# Patient Record
Sex: Female | Born: 1985 | Race: White | Hispanic: No | Marital: Single
Health system: Southern US, Community
[De-identification: ages and names within clinical notes are randomized; demographics above are authoritative.]

---
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
## 2007-01-04 ENCOUNTER — Ambulatory Visit: Payer: Self-pay | Admitting: Internal Medicine

## 2007-01-07 IMAGING — CR DG CHEST 1V PORT
1 series · 1 of 1 positions shown · non-contrast
Comparison: none

REASON FOR EXAM: Port-A-Cath placement
COMMENTS:

[view not recorded]
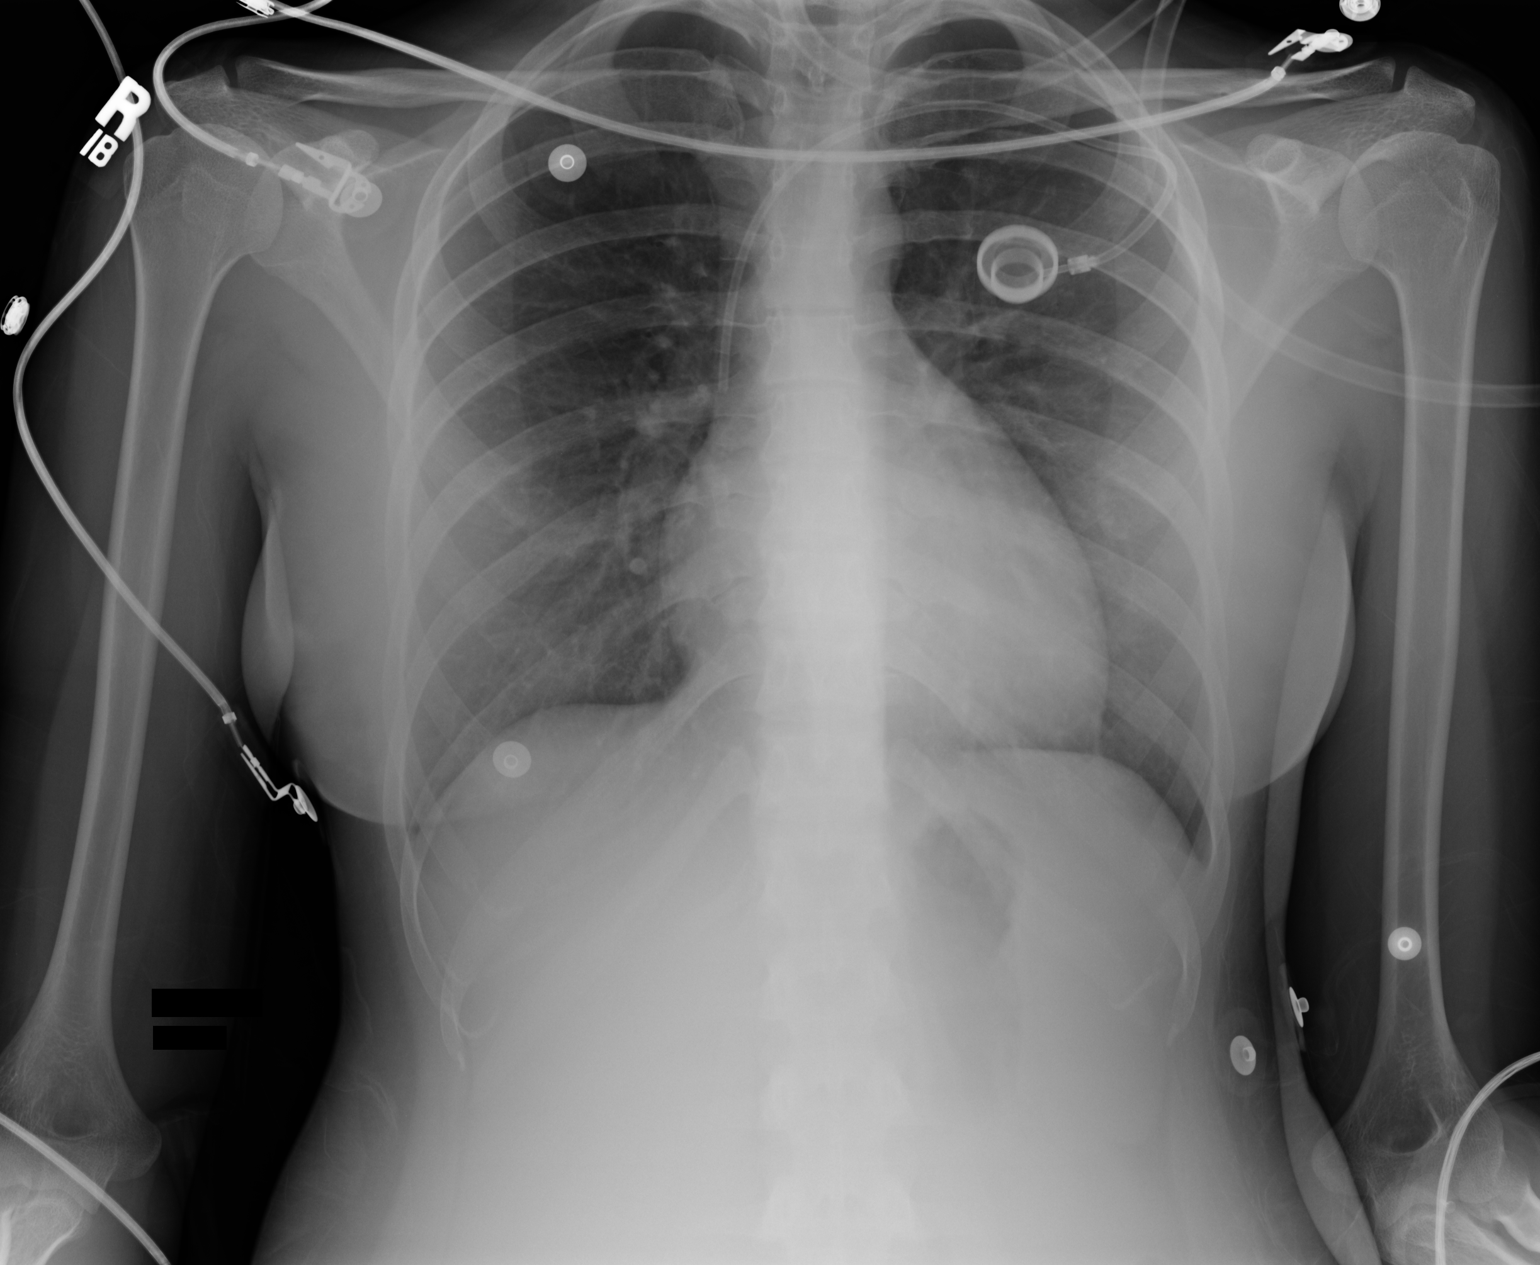

[1 of 1 positions shown; findings below may reference images not displayed]

PROCEDURE:     DXR - DXR PORTABLE CHEST SINGLE VIEW  - July 26, 2006 [DATE]

RESULT:          AP view of the chest is compared to the prior exam of
06/23/2006.

A Port-A-Cath is now present.  The tip of the Port-A-Cath is projected over
the inferior portion of the superior vena cava.  No pneumothorax is seen.
Heart size is normal.
IMPRESSION: 1.     The lung fields are clear.
2.     A Port-A-Cath is now present.

## 2007-01-18 ENCOUNTER — Ambulatory Visit: Payer: Self-pay | Admitting: Internal Medicine

## 2007-02-02 ENCOUNTER — Ambulatory Visit: Payer: Self-pay | Admitting: Internal Medicine

## 2007-02-12 ENCOUNTER — Inpatient Hospital Stay: Payer: Self-pay | Admitting: Otolaryngology

## 2007-03-05 ENCOUNTER — Ambulatory Visit: Payer: Self-pay | Admitting: Internal Medicine

## 2007-04-04 ENCOUNTER — Ambulatory Visit: Payer: Self-pay | Admitting: Internal Medicine

## 2007-05-05 ENCOUNTER — Ambulatory Visit: Payer: Self-pay | Admitting: Internal Medicine

## 2007-05-17 ENCOUNTER — Ambulatory Visit: Payer: Self-pay | Admitting: Internal Medicine

## 2007-06-04 ENCOUNTER — Ambulatory Visit: Payer: Self-pay | Admitting: Internal Medicine

## 2007-07-05 ENCOUNTER — Ambulatory Visit: Payer: Self-pay | Admitting: Internal Medicine

## 2007-07-26 ENCOUNTER — Ambulatory Visit: Payer: Self-pay | Admitting: Internal Medicine

## 2007-08-05 ENCOUNTER — Ambulatory Visit: Payer: Self-pay | Admitting: Internal Medicine

## 2007-09-04 ENCOUNTER — Ambulatory Visit: Payer: Self-pay | Admitting: Internal Medicine

## 2007-10-05 ENCOUNTER — Ambulatory Visit: Payer: Self-pay | Admitting: Internal Medicine

## 2007-10-25 ENCOUNTER — Ambulatory Visit: Payer: Self-pay | Admitting: Internal Medicine

## 2007-11-04 ENCOUNTER — Ambulatory Visit: Payer: Self-pay | Admitting: Internal Medicine

## 2007-12-05 ENCOUNTER — Ambulatory Visit: Payer: Self-pay | Admitting: Internal Medicine

## 2007-12-16 ENCOUNTER — Ambulatory Visit: Payer: Self-pay | Admitting: Internal Medicine

## 2008-01-05 ENCOUNTER — Ambulatory Visit: Payer: Self-pay | Admitting: Internal Medicine

## 2008-02-02 ENCOUNTER — Ambulatory Visit: Payer: Self-pay | Admitting: Internal Medicine

## 2008-02-26 ENCOUNTER — Ambulatory Visit: Payer: Self-pay | Admitting: Internal Medicine

## 2008-03-04 ENCOUNTER — Ambulatory Visit: Payer: Self-pay | Admitting: Internal Medicine

## 2008-04-03 ENCOUNTER — Ambulatory Visit: Payer: Self-pay | Admitting: Internal Medicine

## 2008-04-10 ENCOUNTER — Ambulatory Visit: Payer: Self-pay | Admitting: Internal Medicine

## 2008-04-13 ENCOUNTER — Ambulatory Visit: Payer: Self-pay | Admitting: Internal Medicine

## 2008-05-04 ENCOUNTER — Ambulatory Visit: Payer: Self-pay | Admitting: Internal Medicine

## 2008-06-03 ENCOUNTER — Ambulatory Visit: Payer: Self-pay | Admitting: Internal Medicine

## 2008-07-04 ENCOUNTER — Ambulatory Visit: Payer: Self-pay | Admitting: Internal Medicine

## 2008-08-04 ENCOUNTER — Ambulatory Visit: Payer: Self-pay | Admitting: Internal Medicine

## 2008-08-21 ENCOUNTER — Ambulatory Visit: Payer: Self-pay | Admitting: Internal Medicine

## 2008-09-03 ENCOUNTER — Ambulatory Visit: Payer: Self-pay | Admitting: Internal Medicine

## 2008-10-04 ENCOUNTER — Ambulatory Visit: Payer: Self-pay | Admitting: Internal Medicine

## 2008-11-03 ENCOUNTER — Ambulatory Visit: Payer: Self-pay | Admitting: Internal Medicine

## 2008-12-04 ENCOUNTER — Ambulatory Visit: Payer: Self-pay | Admitting: Internal Medicine

## 2009-01-04 ENCOUNTER — Ambulatory Visit: Payer: Self-pay | Admitting: Internal Medicine

## 2009-01-12 ENCOUNTER — Ambulatory Visit: Payer: Self-pay | Admitting: Internal Medicine

## 2009-02-01 ENCOUNTER — Ambulatory Visit: Payer: Self-pay | Admitting: Internal Medicine

## 2009-03-04 ENCOUNTER — Ambulatory Visit: Payer: Self-pay | Admitting: Internal Medicine

## 2009-03-28 IMAGING — CT CT ABD-PELV W/ CM
1 of 2 series · 15 of 32 positions shown, 19 images · non-contrast
Comparison: none

REASON FOR EXAM: nasopharyngeal  CA  abnormal PET scan eval pelvic mass
vs bowel
COMMENTS:

[Series 2: abdomen · axial · 0.57mm/px · z∈[-68,+308]mm · 15 of 51 slices shown, 19 images]
[im 2/51  soft-tissue]
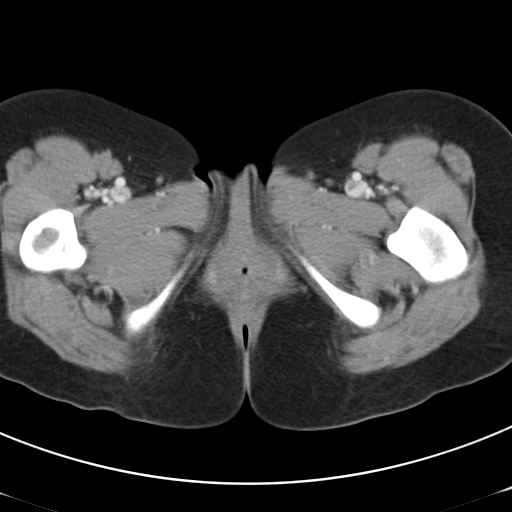
[im 2/51  bone]
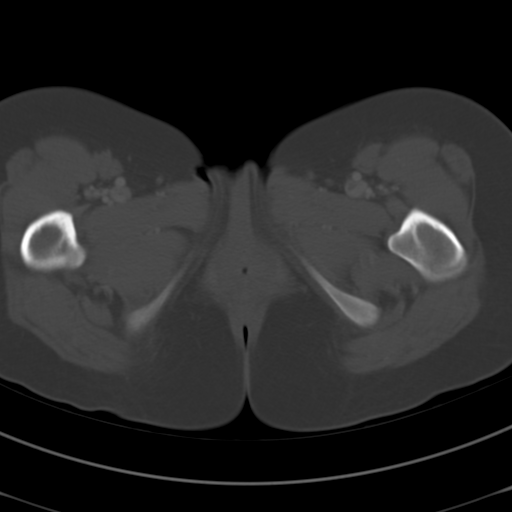
[im 6/51  soft-tissue]
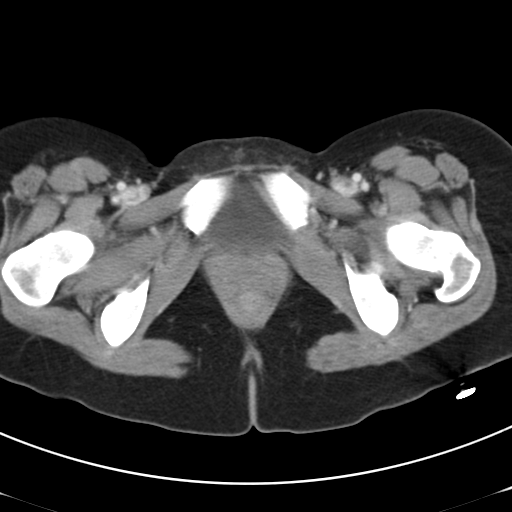
[im 10/51  soft-tissue]
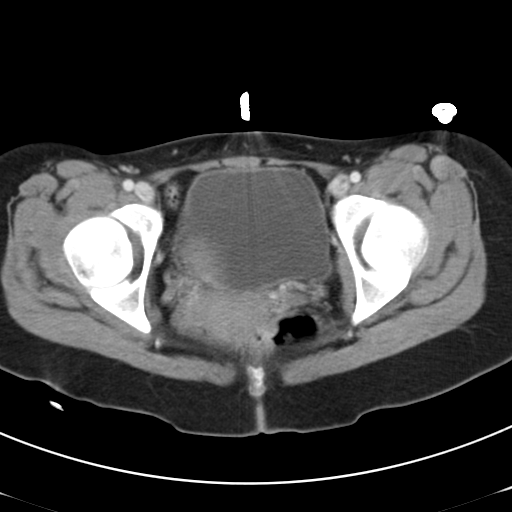
[im 14/51  soft-tissue]
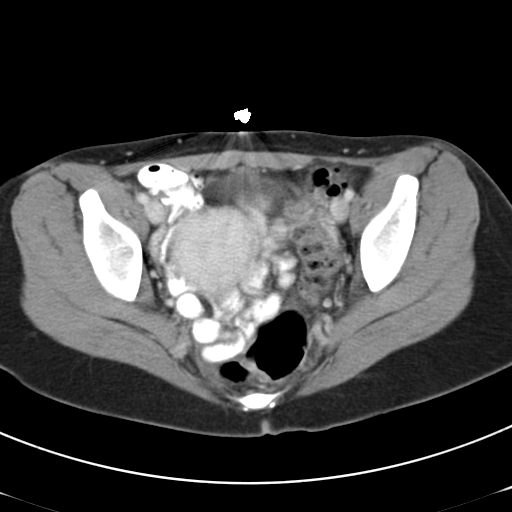
[im 18/51  soft-tissue]
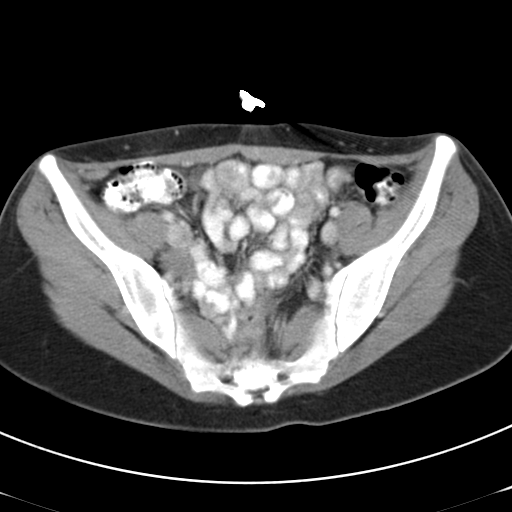
[im 22/51  soft-tissue]
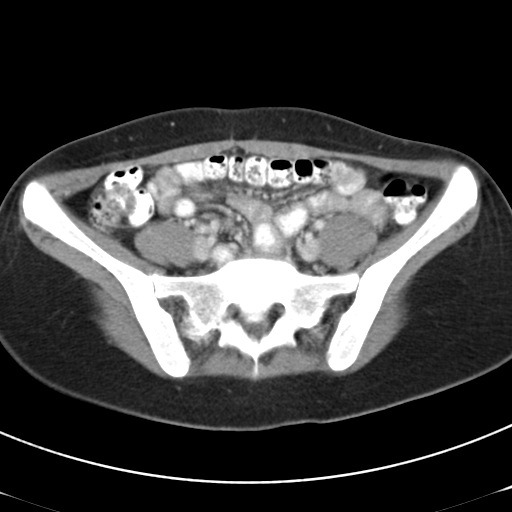
[im 26/51  soft-tissue]
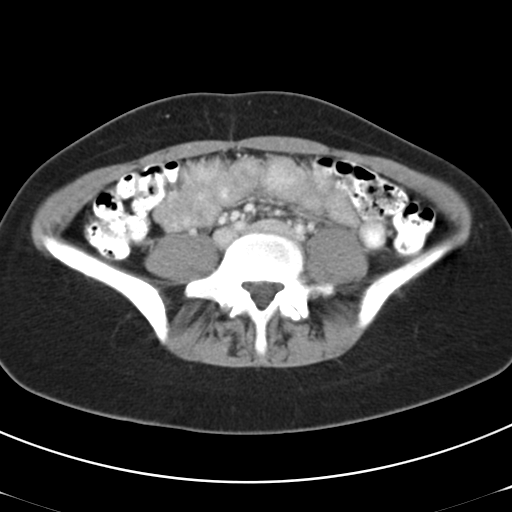
[im 29/51  soft-tissue]
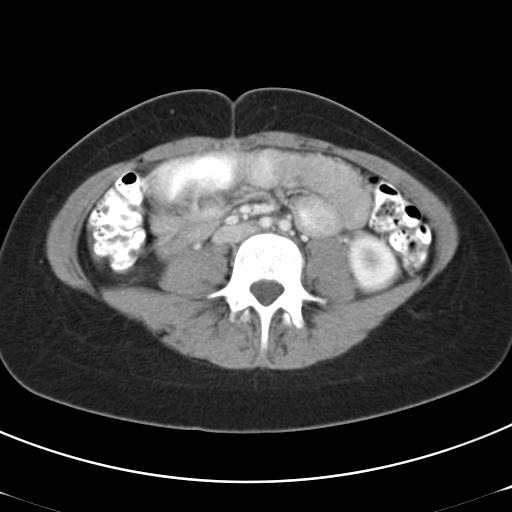
[im 33/51  soft-tissue]
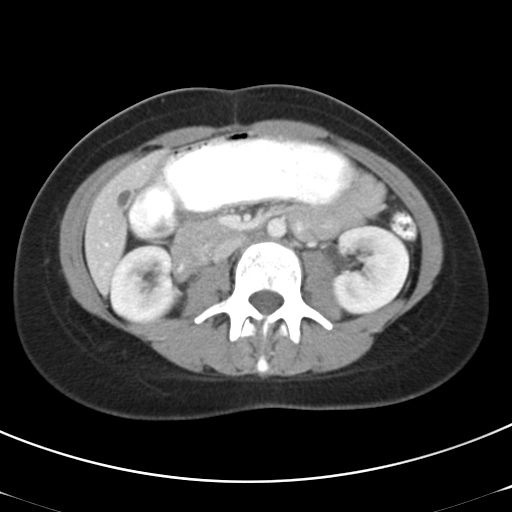
[im 33/51  bone]
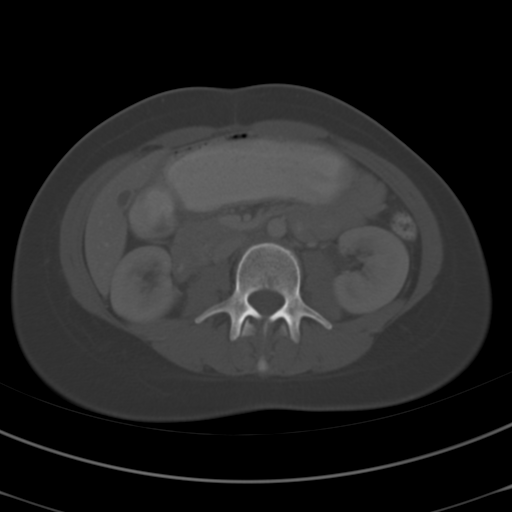
[im 37/51  soft-tissue]
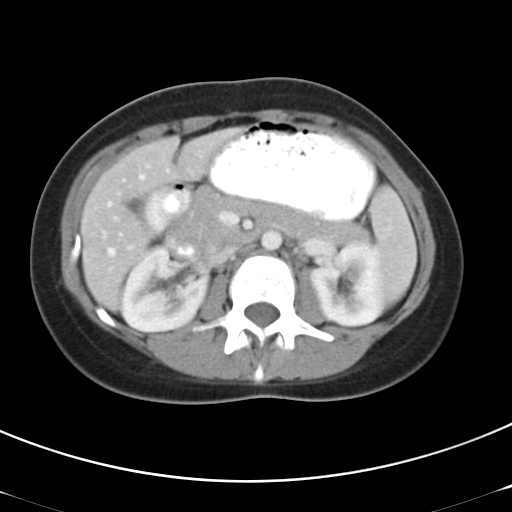
[im 41/51  soft-tissue]
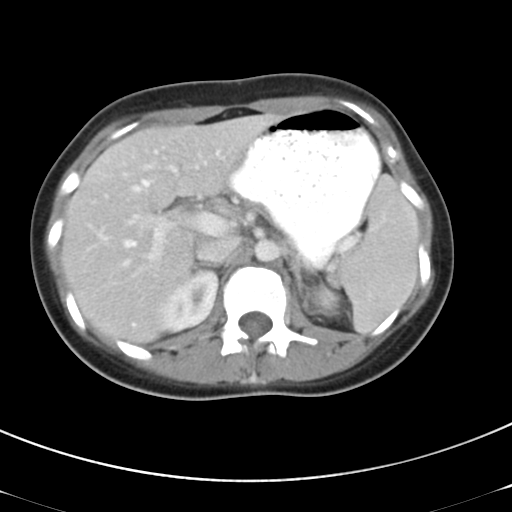
[im 43/51  lung]
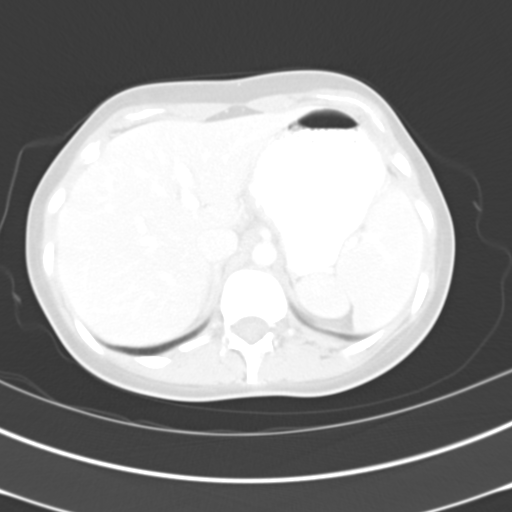
[im 45/51  soft-tissue]
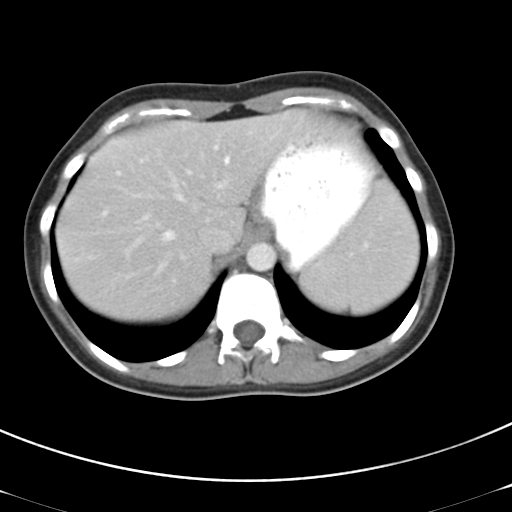
[im 45/51  lung]
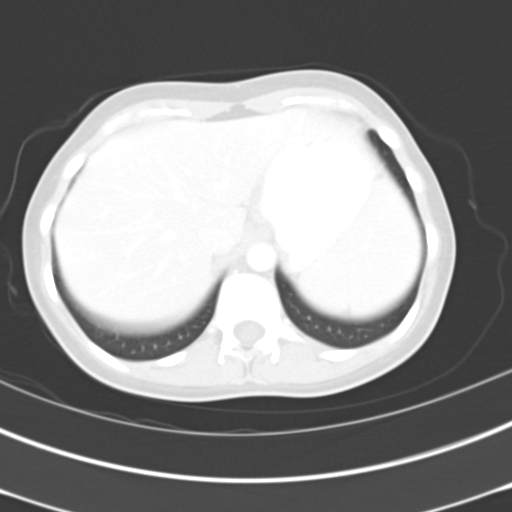
[im 47/51  lung]
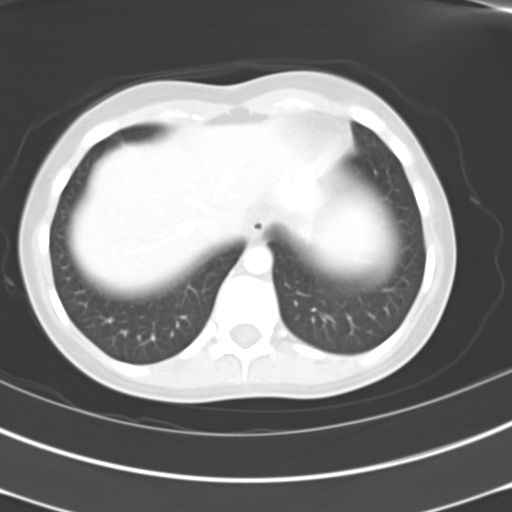
[im 49/51  soft-tissue]
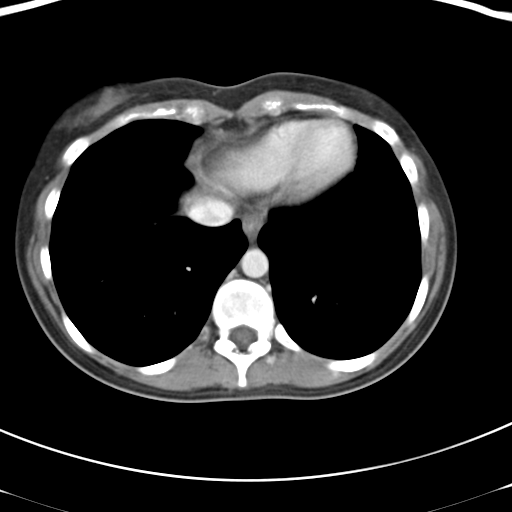
[im 49/51  lung]
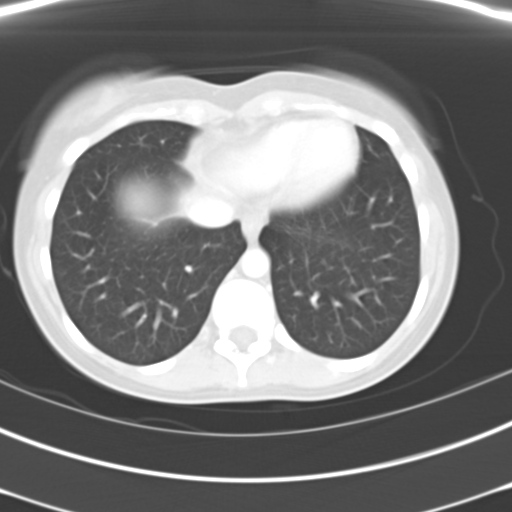

[15 of 32 positions shown; findings below may reference images not displayed]

PROCEDURE:     CT  - CT ABDOMEN / PELVIS  W  - October 14, 2008  [DATE]

RESULT:     CT of the abdomen and pelvis is performed utilizing
approximately 80 ml of [id number] iodinated intravenous contrast along with
oral contrast. There is no previous CT of the abdomen and pelvis for
comparison. The images are reconstructed in the axial plane at 8 mm slice
thickness. At the time of interpretation, physician performed multiplanar
and 3D reconstructions are created utilizing the [employer] software.

The images demonstrate a normal appearance of the lung bases with no
discrete mass, infiltrate or effusion. The liver, spleen, pancreas,
gallbladder, kidneys and adrenal glands appear to be normal. The abdominal
aorta is normal. There is no ascites, bowel wall thickening or abnormal
bowel distention. The uterus and ovaries appear to be grossly normal. The
RIGHT ovary is slightly larger than the LEFT. The urinary bladder is
unremarkable. There is no inguinal or pelvic mass or adenopathy. There is no
evidence of abnormal appearance of the bowel to cause the area of increased
localization. Again, there is no adenopathy.
IMPRESSION: Unremarkable CT of the abdomen and pelvis.

## 2009-04-03 ENCOUNTER — Ambulatory Visit: Payer: Self-pay | Admitting: Internal Medicine

## 2009-05-04 ENCOUNTER — Ambulatory Visit: Payer: Self-pay | Admitting: Internal Medicine

## 2009-06-03 ENCOUNTER — Ambulatory Visit: Payer: Self-pay | Admitting: Internal Medicine

## 2009-07-04 ENCOUNTER — Ambulatory Visit: Payer: Self-pay | Admitting: Internal Medicine

## 2009-08-04 ENCOUNTER — Ambulatory Visit: Payer: Self-pay | Admitting: Internal Medicine

## 2009-09-03 ENCOUNTER — Ambulatory Visit: Payer: Self-pay | Admitting: Internal Medicine

## 2009-10-04 ENCOUNTER — Ambulatory Visit: Payer: Self-pay | Admitting: Internal Medicine

## 2009-10-08 ENCOUNTER — Ambulatory Visit: Payer: Self-pay | Admitting: Internal Medicine

## 2009-11-03 ENCOUNTER — Ambulatory Visit: Payer: Self-pay | Admitting: Internal Medicine

## 2009-12-04 ENCOUNTER — Ambulatory Visit: Payer: Self-pay | Admitting: Internal Medicine

## 2010-01-04 ENCOUNTER — Ambulatory Visit: Payer: Self-pay | Admitting: Internal Medicine

## 2010-02-01 ENCOUNTER — Ambulatory Visit: Payer: Self-pay | Admitting: Internal Medicine

## 2010-02-04 ENCOUNTER — Ambulatory Visit: Payer: Self-pay | Admitting: Internal Medicine

## 2010-03-04 ENCOUNTER — Ambulatory Visit: Payer: Self-pay | Admitting: Internal Medicine

## 2010-05-04 ENCOUNTER — Ambulatory Visit: Payer: Self-pay | Admitting: Internal Medicine

## 2010-05-13 ENCOUNTER — Ambulatory Visit: Payer: Self-pay | Admitting: Internal Medicine

## 2010-06-03 ENCOUNTER — Ambulatory Visit: Payer: Self-pay | Admitting: Internal Medicine

## 2010-07-19 ENCOUNTER — Ambulatory Visit: Payer: Self-pay | Admitting: Internal Medicine

## 2010-08-04 ENCOUNTER — Ambulatory Visit: Payer: Self-pay | Admitting: Internal Medicine

## 2010-09-03 ENCOUNTER — Ambulatory Visit: Payer: Self-pay | Admitting: Internal Medicine

## 2010-12-23 ENCOUNTER — Ambulatory Visit: Payer: Self-pay | Admitting: Internal Medicine

## 2011-01-04 ENCOUNTER — Ambulatory Visit: Payer: Self-pay | Admitting: Internal Medicine

## 2011-06-06 IMAGING — CR DG CHEST 2V
1 series · 2 of 2 positions shown · non-contrast
Comparison: none

REASON FOR EXAM: hx of head and neck can
COMMENTS:

[Series 1: view not recorded · 0.17mm/px · 2 of 2 slices shown]
[im 1/2]
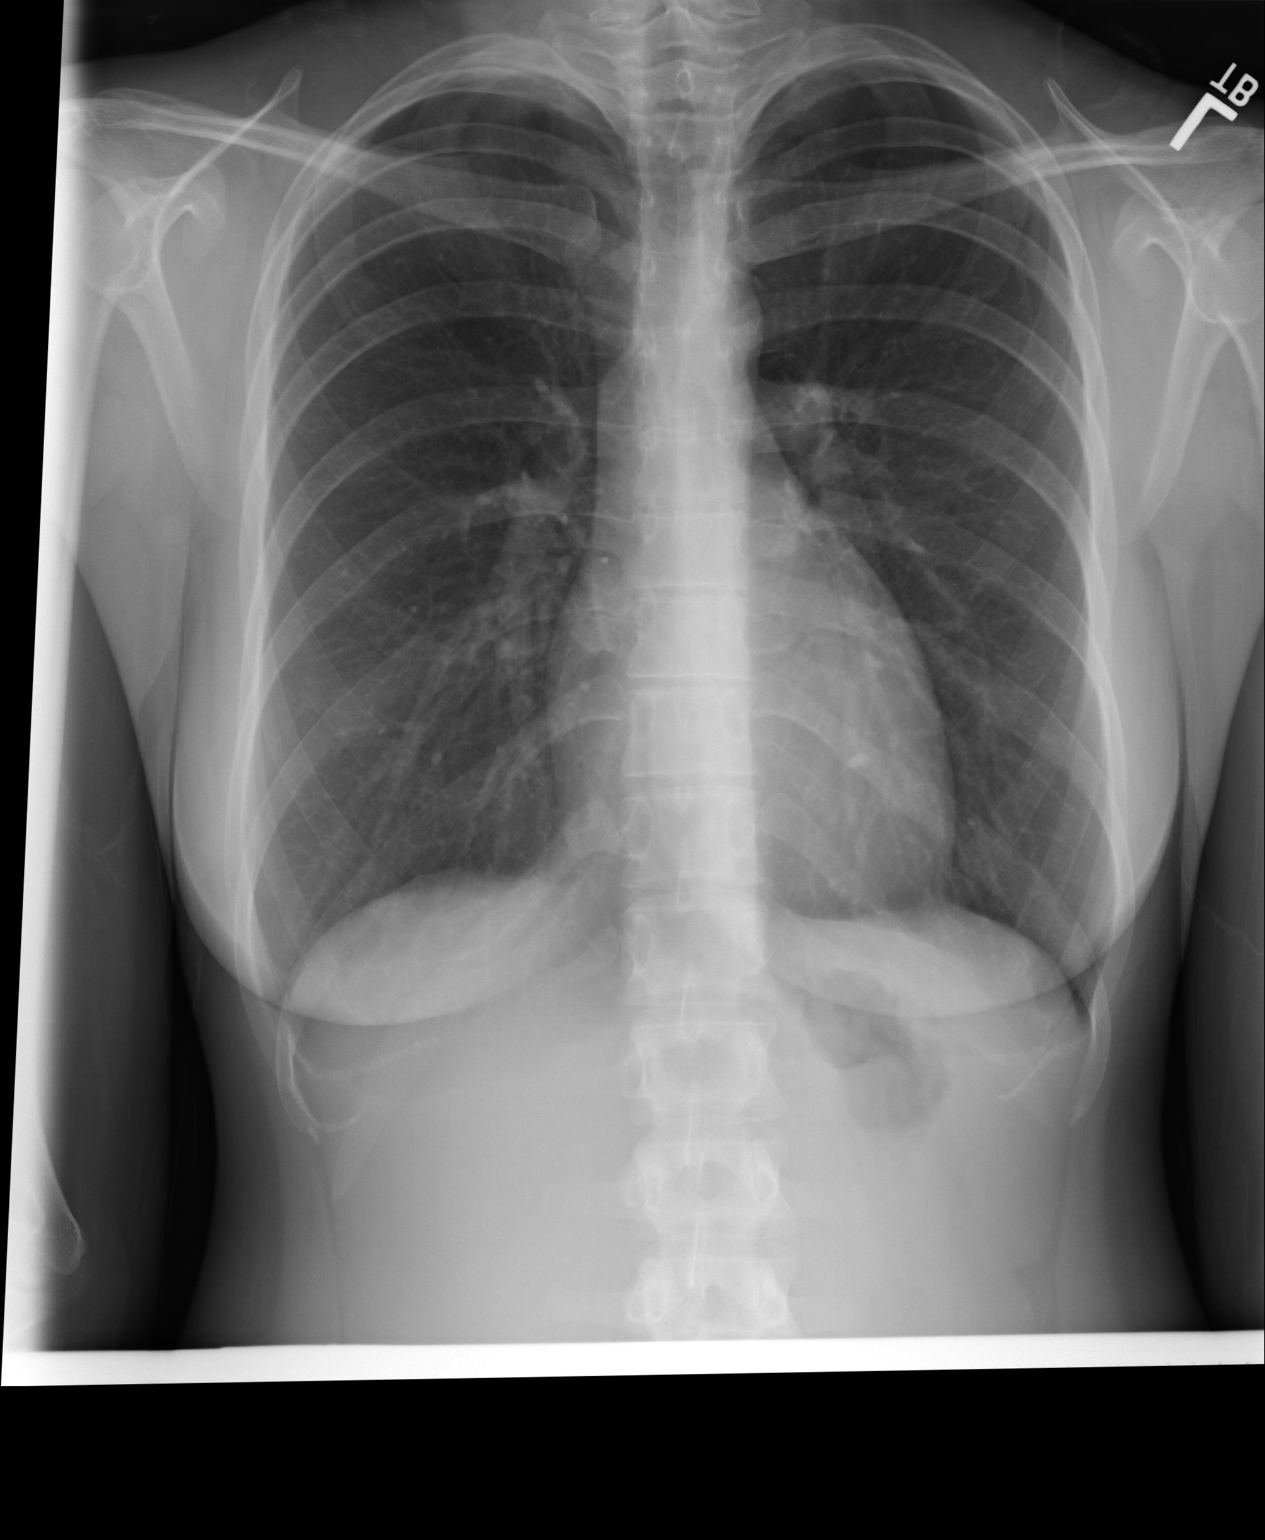
[im 2/2]
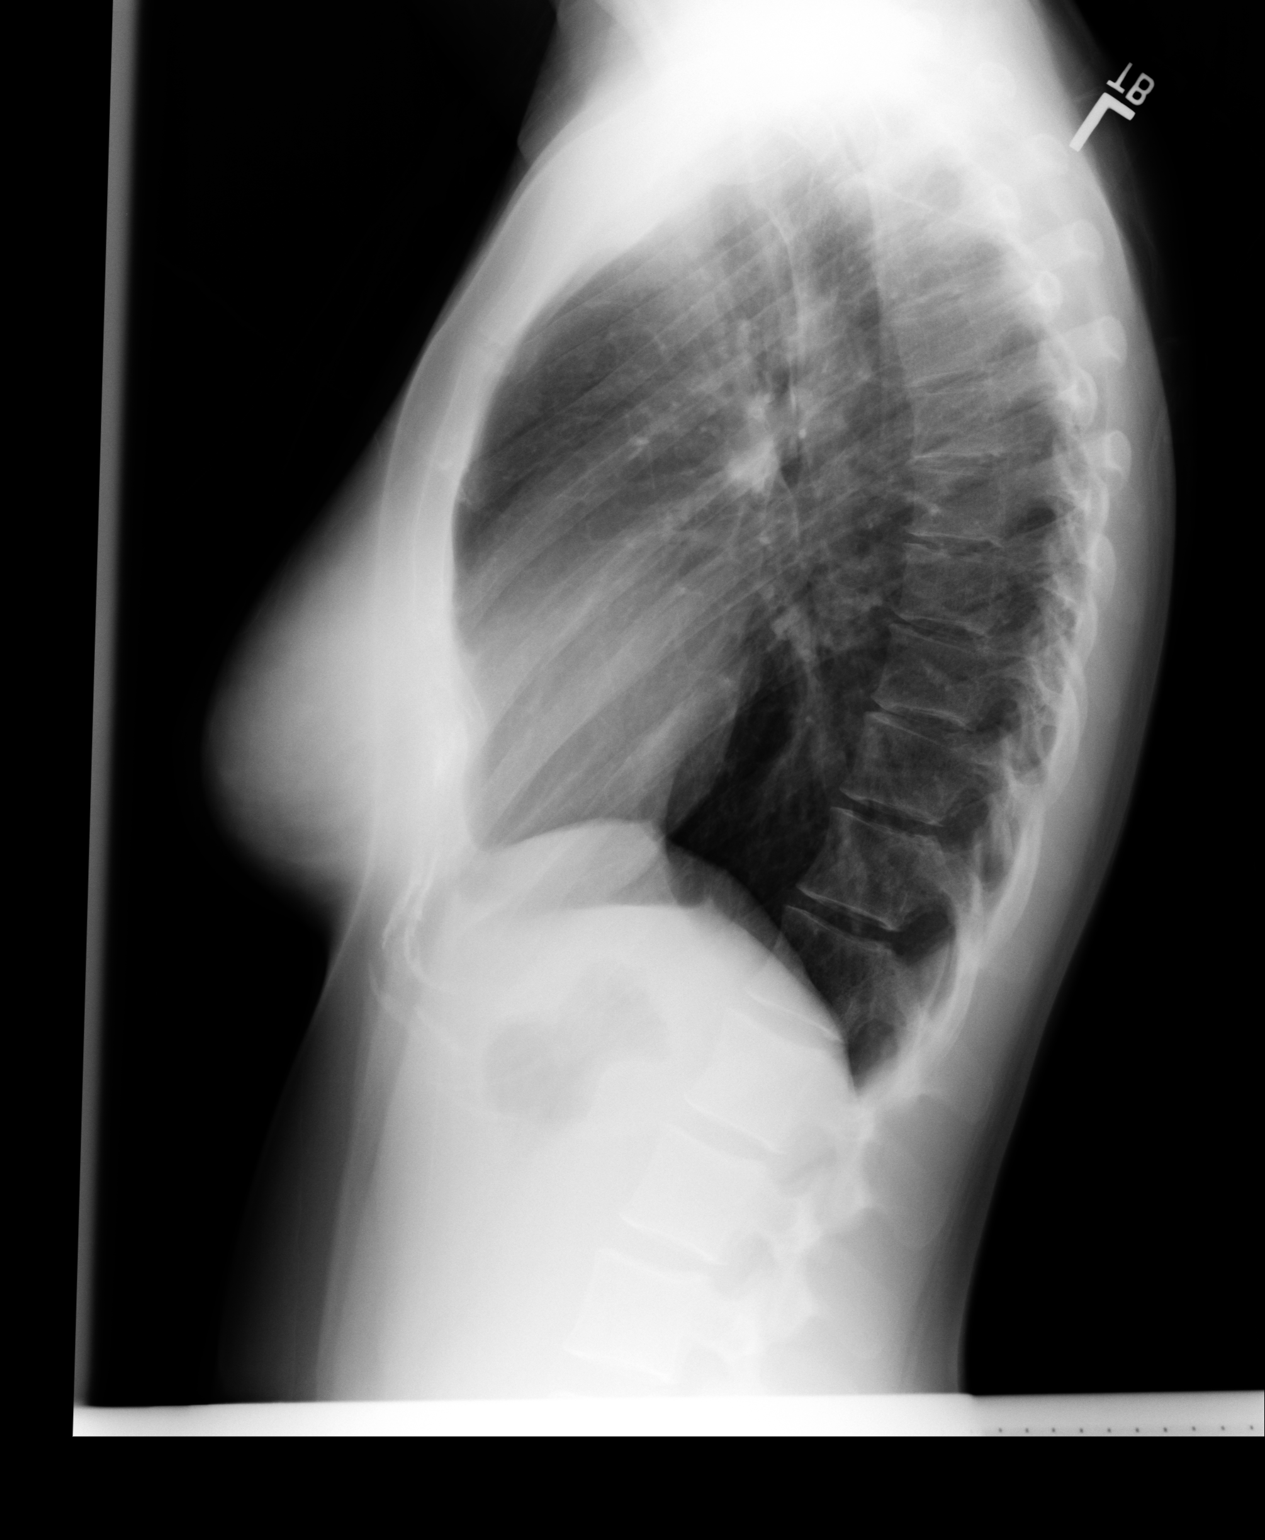

[2 of 2 positions shown; findings below may reference images not displayed]

PROCEDURE:     DXR - DXR CHEST PA (OR AP) AND LATERAL  - December 23, 2010  [DATE]

RESULT:     Comparison made to study of 10 G and 7488.

The lungs are mildly hyperinflated. There is no focal infiltrate. The
cardiac silhouette is normal in size. The pulmonary vascularity is not
engorged. There is slightly increased prominence of the left hilar
structures today as compared to the previous exam. The bony structures are
grossly normal.
IMPRESSION: I see no acute pulmonary parenchymal abnormality nor
evidence of pleural effusions. There is slightly increased prominence of the
left hilar structures today.

## 2011-07-19 ENCOUNTER — Ambulatory Visit: Payer: Self-pay | Admitting: Internal Medicine

## 2011-08-05 ENCOUNTER — Ambulatory Visit: Payer: Self-pay | Admitting: Internal Medicine

## 2011-10-25 ENCOUNTER — Ambulatory Visit: Payer: Self-pay | Admitting: Internal Medicine

## 2011-11-04 ENCOUNTER — Ambulatory Visit: Payer: Self-pay | Admitting: Internal Medicine

## 2012-04-17 ENCOUNTER — Ambulatory Visit: Payer: Self-pay | Admitting: Internal Medicine

## 2012-04-18 LAB — CREATININE, SERUM
Creatinine: 0.58 mg/dL — ABNORMAL LOW (ref 0.60–1.30)
EGFR (African American): >60

## 2012-04-18 LAB — TSH: Thyroid Stimulating Horm: 6.87 u[IU]/mL — ABNORMAL HIGH

## 2012-05-04 ENCOUNTER — Ambulatory Visit: Payer: Self-pay | Admitting: Internal Medicine

## 2012-05-16 LAB — POTASSIUM: Potassium: 3.4 mmol/L — ABNORMAL LOW (ref 3.5–5.1)

## 2012-05-16 LAB — CBC CANCER CENTER
Basophil #: 0 x10 3/mm (ref 0.0–0.1)
Eosinophil #: 0.1 x10 3/mm (ref 0.0–0.7)
Eosinophil %: 1.3 %
HCT: 37.4 % (ref 35.0–47.0)
HGB: 12.5 g/dL (ref 12.0–16.0)
Lymphocyte #: 2 x10 3/mm (ref 1.0–3.6)
MCH: 29.6 pg (ref 26.0–34.0)
MCHC: 33.4 g/dL (ref 32.0–36.0)
Monocyte #: 0.5 x10 3/mm (ref 0.2–0.9)
Monocyte %: 8.7 %
Neutrophil #: 3.6 x10 3/mm (ref 1.4–6.5)
Neutrophil %: 58 %
RBC: 4.23 10*6/uL (ref 3.80–5.20)
RDW: 13.4 % (ref 11.5–14.5)
WBC: 6.2 x10 3/mm (ref 3.6–11.0)

## 2012-06-03 ENCOUNTER — Ambulatory Visit: Payer: Self-pay | Admitting: Internal Medicine

## 2012-08-29 ENCOUNTER — Ambulatory Visit: Payer: Self-pay | Admitting: Internal Medicine

## 2012-09-03 ENCOUNTER — Ambulatory Visit: Payer: Self-pay | Admitting: Internal Medicine

## 2012-12-04 ENCOUNTER — Ambulatory Visit: Payer: Self-pay | Admitting: Internal Medicine

## 2013-01-23 ENCOUNTER — Ambulatory Visit: Payer: Self-pay | Admitting: Internal Medicine

## 2013-01-27 LAB — CBC CANCER CENTER
Basophil #: 0 x10 3/mm (ref 0.0–0.1)
Basophil %: 0.6 %
Eosinophil #: 0.1 x10 3/mm (ref 0.0–0.7)
Eosinophil %: 1.3 %
HCT: 39.1 % (ref 35.0–47.0)
HGB: 13.4 g/dL (ref 12.0–16.0)
Monocyte #: 0.4 x10 3/mm (ref 0.2–0.9)
Neutrophil #: 4.8 x10 3/mm (ref 1.4–6.5)
Neutrophil %: 66.7 %
Platelet: 234 x10 3/mm (ref 150–440)
RBC: 4.31 10*6/uL (ref 3.80–5.20)
RDW: 12.6 % (ref 11.5–14.5)
WBC: 7.1 x10 3/mm (ref 3.6–11.0)

## 2013-01-27 LAB — MAGNESIUM: Magnesium: 1.7 mg/dL — ABNORMAL LOW

## 2013-01-27 LAB — TSH: Thyroid Stimulating Horm: 8.21 u[IU]/mL — ABNORMAL HIGH

## 2013-02-01 ENCOUNTER — Ambulatory Visit: Payer: Self-pay | Admitting: Internal Medicine

## 2013-02-10 LAB — POTASSIUM: Potassium: 3.5 mmol/L (ref 3.5–5.1)

## 2013-03-04 ENCOUNTER — Ambulatory Visit: Payer: Self-pay | Admitting: Internal Medicine

## 2021-03-14 ENCOUNTER — Other Ambulatory Visit: Payer: Self-pay

## 2021-03-14 ENCOUNTER — Emergency Department
Admission: EM | Admit: 2021-03-14 | Discharge: 2021-03-14 | Disposition: A | Discharge status: Home / Self Care | Payer: BC Managed Care – PPO | Attending: Emergency Medicine | Admitting: Emergency Medicine

## 2021-03-14 ENCOUNTER — Encounter: Payer: Self-pay | Admitting: Emergency Medicine

## 2021-03-14 DIAGNOSIS — K047 Periapical abscess without sinus: Secondary | ICD-10-CM | POA: Diagnosis not present

## 2021-03-14 LAB — CBC WITH DIFFERENTIAL/PLATELET
Abs Immature Granulocytes: 0.02 10*3/uL (ref 0.00–0.07)
Basophils Absolute: 0 10*3/uL (ref 0.0–0.1)
Basophils Relative: 0 %
Eosinophils Absolute: 0 10*3/uL (ref 0.0–0.5)
Eosinophils Relative: 0 %
HCT: 39.9 % (ref 36.0–46.0)
Hemoglobin: 13.7 g/dL (ref 12.0–15.0)
Immature Granulocytes: 0 %
Lymphocytes Relative: 9 %
Lymphs Abs: 0.6 10*3/uL — ABNORMAL LOW (ref 0.7–4.0)
MCH: 30.6 pg (ref 26.0–34.0)
MCHC: 34.3 g/dL (ref 30.0–36.0)
MCV: 89.3 fL (ref 80.0–100.0)
Monocytes Absolute: 0.2 10*3/uL (ref 0.1–1.0)
Monocytes Relative: 3 %
Neutro Abs: 5.5 10*3/uL (ref 1.7–7.7)
Neutrophils Relative %: 88 %
Platelets: 190 10*3/uL (ref 150–400)
RBC: 4.47 MIL/uL (ref 3.87–5.11)
RDW: 11.8 % (ref 11.5–15.5)
WBC: 6.3 10*3/uL (ref 4.0–10.5)
nRBC: 0 % (ref 0.0–0.2)

## 2021-03-14 LAB — COMPREHENSIVE METABOLIC PANEL
ALT: 12 U/L (ref 0–44)
AST: 19 U/L (ref 15–41)
Albumin: 4.8 g/dL (ref 3.5–5.0)
Alkaline Phosphatase: 51 U/L (ref 38–126)
Anion gap: 10 (ref 5–15)
BUN: 9 mg/dL (ref 6–20)
CO2: 26 mmol/L (ref 22–32)
Calcium: 10 mg/dL (ref 8.9–10.3)
Chloride: 102 mmol/L (ref 98–111)
Creatinine, Ser: 0.54 mg/dL (ref 0.44–1.00)
GFR, Estimated: >60 mL/min (ref >60)
Glucose, Bld: 102 mg/dL — ABNORMAL HIGH (ref 70–99)
Potassium: 3.1 mmol/L — ABNORMAL LOW (ref 3.5–5.1)
Sodium: 138 mmol/L (ref 135–145)
Total Bilirubin: 1 mg/dL (ref 0.3–1.2)
Total Protein: 7.8 g/dL (ref 6.5–8.1)

## 2021-03-14 MED ORDER — AMOXICILLIN-POT CLAVULANATE 875-125 MG PO TABS: 1.0000 | ORAL_TABLET | Freq: Two times a day (BID) | ORAL | 0 refills | Status: AC

## 2021-03-14 MED ORDER — SODIUM CHLORIDE 0.9 % IV SOLN
3.0000 g | Freq: Once | INTRAVENOUS | Status: AC
  Administered 2021-03-14: 3 g via INTRAVENOUS
  Filled 2021-03-14: qty 8

## 2021-03-14 NOTE — ED Provider Notes (Signed)
[facility] Emergency Department Provider Note   ____________________________________________   Event Date/Time   First MD Initiated Contact with Patient 03/14/21 1924     (approximate)  I have reviewed the triage vital signs and the nursing notes.   HISTORY  Chief Complaint No chief complaint on file.    HPI Debbie Leach is a 35 y.o. female with a past medical history of poor dentition who presents for a right upper and lateral dental infection with concern for an abscess.  Patient was sent from urgent care with concerns of possible systemic infection given patient's fever and tachycardia.  Patient has taken an Advil prior to arrival with resolution of her fever.  Patient describes aching, 6/10, radiating pain up into the right side of her face.  Patient currently denies any vision changes, tinnitus, difficulty speaking, facial droop, sore throat, chest pain, shortness of breath, abdominal pain, nausea/vomiting/diarrhea, dysuria, or weakness/numbness/paresthesias in any extremity         History reviewed. No pertinent past medical history.  There are no problems to display for this patient.   History reviewed. No pertinent surgical history.  Prior to Admission medications   Medication Sig Start Date End Date Taking? Authorizing Provider  amoxicillin-clavulanate (AUGMENTIN) 875-125 MG tablet Take 1 tablet by mouth 2 (two) times daily for 7 days. 03/14/21 03/21/21 Yes Merwyn Katos, MD    Allergies Patient has no allergy information on record.  No family history on file.  Social History    Review of Systems Constitutional: No fever/chills Eyes: No visual changes. ENT: No sore throat.  Endorses right upper dental pain Cardiovascular: Denies chest pain. Respiratory: Denies shortness of breath. Gastrointestinal: No abdominal pain.  No nausea, no vomiting.  No diarrhea. Genitourinary: Negative for dysuria. Musculoskeletal: Negative for  acute arthralgias Skin: Negative for rash. Neurological: Negative for headaches, weakness/numbness/paresthesias in any extremity Psychiatric: Negative for suicidal ideation/homicidal ideation   ____________________________________________   PHYSICAL EXAM:  VITAL SIGNS: ED Triage Vitals  Enc Vitals Group     BP 03/14/21 1846 137/86     Pulse Rate 03/14/21 1846 (!) 132     Resp 03/14/21 1846 18     Temp 03/14/21 1846 99.6 F (37.6 C)     Temp Source 03/14/21 1846 Oral     SpO2 03/14/21 1846 98 %     Weight 03/14/21 1843 120 lb (54.4 kg)     Height 03/14/21 1843 5\' 4"  (1.626 m)     Head Circumference --      Peak Flow --      Pain Score 03/14/21 1842 6     Pain Loc --      Pain Edu? --      Excl. in GC? --    Constitutional: Alert and oriented. Well appearing and in no acute distress. Eyes: Conjunctivae are normal. PERRL. Head: Atraumatic. Nose: No congestion/rhinnorhea. Mouth/Throat: Mucous membranes are moist.  Multiple dental caries and poor dentition with erythema and edema to right upper maxillary teeth Neck: No stridor Cardiovascular: Grossly normal heart sounds.  Good peripheral circulation. Respiratory: Normal respiratory effort.  No retractions. Gastrointestinal: Soft and nontender. No distention. Musculoskeletal: No obvious deformities Neurologic:  Normal speech and language. No gross focal neurologic deficits are appreciated. Skin:  Skin is warm and dry. No rash noted. Psychiatric: Mood and affect are normal. Speech and behavior are normal.  ____________________________________________   LABS (all labs ordered are listed, but only abnormal results are displayed)  Labs Reviewed  COMPREHENSIVE METABOLIC PANEL - Abnormal; Notable for the following components:      Result Value   Potassium 3.1 (*)    Glucose, Bld 102 (*)    All other components within normal limits  CBC WITH DIFFERENTIAL/PLATELET - Abnormal; Notable for the following components:   Lymphs  Abs 0.6 (*)    All other components within normal limits  CULTURE, BLOOD (SINGLE)   PROCEDURES  Procedure(s) performed (including Critical Care):  Procedures   ____________________________________________   INITIAL IMPRESSION / ASSESSMENT AND PLAN / ED COURSE  As part of my medical decision making, I reviewed the following data within the electronic MEDICAL RECORD NUMBER Nursing notes reviewed and incorporated, Labs reviewed, Old chart reviewed, and Notes from prior ED visits reviewed and incorporated      Patient not immunosuppressed. No e/o tooth fracture, avulsion, or bleeding socket. No e/o RPA, PTA, Ludwigs angina No e/o gingival hyperplasia or concern for drug reaction.  Rx Ibuprofen.  Augmentin for likely dental abscess. Disposition: Discharge home. Discussed return precautions for odontogenic infections and other dental pain emergencies. Will provide dental clinic list.      ____________________________________________   FINAL CLINICAL IMPRESSION(S) / ED DIAGNOSES  Final diagnoses:  Dental abscess     ED Discharge Orders         Ordered    amoxicillin-clavulanate (AUGMENTIN) 875-125 MG tablet  2 times daily        03/14/21 2009           Note:  This document was prepared using Dragon voice recognition software and may include unintentional dictation errors.   Merwyn Katos, MD 03/15/21 [phone]

## 2021-03-14 NOTE — ED Triage Notes (Signed)
Arrives from [facility] for evaluation of dental abscess to right upper jaw.  Patient referred to ED due to temp:  101.5 and elevated HR:  135.  Patient states Advil taken at 1700 for fever.  AAOx3.  Skin warm and dry. NAD

## 2021-03-19 LAB — CULTURE, BLOOD (SINGLE)
Culture: NO GROWTH
Special Requests: ADEQUATE
